# Patient Record
Sex: Male | Born: 1985 | Race: Black or African American | Hispanic: No | Marital: Single | Smoking: Former smoker
Health system: Southern US, Community
[De-identification: ages and names within clinical notes are randomized; demographics above are authoritative.]

## PROBLEM LIST (undated history)

## (undated) HISTORY — PX: HAND EXTENSOR TENDON EXCISION: SUR518

---

## 2011-12-08 ENCOUNTER — Ambulatory Visit: Payer: Self-pay | Admitting: Family Medicine

## 2013-06-30 ENCOUNTER — Ambulatory Visit: Payer: Self-pay | Admitting: Physician Assistant

## 2013-08-14 ENCOUNTER — Emergency Department: Payer: Self-pay | Admitting: Internal Medicine

## 2014-04-21 ENCOUNTER — Emergency Department: Payer: Self-pay | Admitting: Emergency Medicine

## 2014-05-03 ENCOUNTER — Emergency Department: Payer: Self-pay | Admitting: Emergency Medicine

## 2015-03-09 ENCOUNTER — Encounter: Payer: Self-pay | Admitting: General Practice

## 2015-03-09 ENCOUNTER — Emergency Department
Admission: EM | Admit: 2015-03-09 | Discharge: 2015-03-09 | Disposition: A | Discharge status: Home / Self Care | Payer: Self-pay | Attending: Emergency Medicine | Admitting: Emergency Medicine

## 2015-03-09 DIAGNOSIS — H6091 Unspecified otitis externa, right ear: Secondary | ICD-10-CM | POA: Insufficient documentation

## 2015-03-09 MED ORDER — IBUPROFEN 800 MG PO TABS: 800.0000 mg | ORAL_TABLET | Freq: Three times a day (TID) | ORAL | Status: AC | PRN

## 2015-03-09 MED ORDER — NEOMYCIN-POLYMYXIN-HC 3.5-10000-1 OT SOLN: 3.0000 [drp] | Freq: Three times a day (TID) | OTIC | Status: AC

## 2015-03-09 MED ORDER — IBUPROFEN 800 MG PO TABS: 800.0000 mg | ORAL_TABLET | Freq: Once | ORAL | Status: DC

## 2015-03-09 NOTE — ED Provider Notes (Signed)
[facility] Emergency Department Provider Note ____________________________________________  Time seen: ----------------------------------------- 6:35 PM on 03/09/2015 -----------------------------------------    I have reviewed the triage vital signs and the nursing notes.   HISTORY  Chief Complaint Otalgia    HPI Antonio Schneider is a 29 y.o. male with one week of pain in the right year. He has noticed blood on the earplugs uses at work. No other drainage. He has mild nasal congestion. No fevers chills shortness of breath chest pain. He denies headache nausea.  History reviewed. No pertinent past medical history.  There are no active problems to display for this patient.   History reviewed. No pertinent past surgical history.  Current Outpatient Rx  Name  Route  Sig  Dispense  Refill  . ibuprofen (ADVIL,MOTRIN) 800 MG tablet   Oral   Take 1 tablet (800 mg total) by mouth every 8 (eight) hours as needed.   15 tablet   0   . neomycin-polymyxin-hydrocortisone (CORTISPORIN) otic solution   Right Ear   Place 3 drops into the right ear 3 (three) times daily.   10 mL   0     Allergies Review of patient's allergies indicates no known allergies.  No family history on file.  Social History History  Substance Use Topics  . Smoking status: Never Smoker   . Smokeless tobacco: Never Used  . Alcohol Use: No    Review of Systems  Constitutional: Negative for fever. Eyes: Negative for visual changes. ENT: Negative for sore throat. Cardiovascular: Negative for chest pain. Respiratory: Negative for shortness of breath. Gastrointestinal: Negative for abdominal pain, vomiting and diarrhea. Genitourinary: Negative for dysuria. Musculoskeletal: Negative for back pain. Skin: Negative for rash. Neurological: Negative for headaches, focal weakness or numbness.    ____________________________________________   PHYSICAL EXAM:  VITAL  SIGNS: ED Triage Vitals  Enc Vitals Group     BP 03/09/15 1739 120/79 mmHg     Pulse Rate 03/09/15 1739 66     Resp 03/09/15 1739 17     Temp 03/09/15 1739 98 F (36.7 C)     Temp Source 03/09/15 1739 Oral     SpO2 03/09/15 1739 97 %     Weight 03/09/15 1739 170 lb (77.111 kg)     Height 03/09/15 1739 5\' 6"  (1.676 m)     Head Cir --      Peak Flow --      Pain Score 03/09/15 1734 7     Pain Loc --      Pain Edu? --      Excl. in GC? --     Constitutional: Alert and oriented. Well appearing and in no distress. Eyes: Conjunctivae are normal. PERRL. Normal extraocular movements. ENT   Head: Normocephalic and atraumatic.      Ear: Right. Mild erythema inside the canal. Normal TM with normal landmarks and cone of light. Non-tender to the external ear.   Nose: No congestion/rhinnorhea.   Mouth/Throat: Mucous membranes are moist.   Neck: No stridor. Hematological/Lymphatic/Immunilogical: No cervical lymphadenopathy. Cardiovascular: Normal rate, regular rhythm.  Respiratory: Normal respiratory effort.No wheezes/rales/rhonchi. Gastrointestinal: Soft and nontender. No distention. Musculoskeletal: Nontender with normal range of motion in all extremities.No lower extremity tenderness nor edema. Neurologic:  Normal speech and language. No gross focal neurologic deficits are appreciated. Skin:  Skin is warm, dry and intact. No rash noted. Psychiatric: Mood and affect are normal. Patient exhibits appropriate insight and judgment.  ____________________________________________       ____________________________________________  ____________________________________________      ____________________________________________   PROCEDURES    ____________________________________________   INITIAL IMPRESSION / ASSESSMENT AND PLAN / ED COURSE    ____________________________________________   FINAL CLINICAL IMPRESSION(S) / ED DIAGNOSES  Final diagnoses:   Otitis externa, right   Mild. Given ibuprofen in the ED. Will start Cortisporin otic. Given work note explaining he should put nothing in his ear for 2 days.   Ignacia BayleyRobert Tumey, PA-C 03/09/15 1839  \I was in the ER and available for consult during the time the patient is here apparently but did not see the patient  Arnaldo NatalPaul F Jeral Zick, MD 03/11/15 [phone]

## 2015-03-09 NOTE — Discharge Instructions (Signed)
Otitis Externa Otitis externa is a bacterial or fungal infection of the outer ear canal. This is the area from the eardrum to the outside of the ear. Otitis externa is sometimes called "swimmer's ear." CAUSES  Possible causes of infection include:  Swimming in dirty water.  Moisture remaining in the ear after swimming or bathing.  Mild injury (trauma) to the ear.  Objects stuck in the ear (foreign body).  Cuts or scrapes (abrasions) on the outside of the ear. SIGNS AND SYMPTOMS  The first symptom of infection is often itching in the ear canal. Later signs and symptoms may include swelling and redness of the ear canal, ear pain, and yellowish-white fluid (pus) coming from the ear. The ear pain may be worse when pulling on the earlobe. DIAGNOSIS  Your health care provider will perform a physical exam. A sample of fluid may be taken from the ear and examined for bacteria or fungi. TREATMENT  Antibiotic ear drops are often given for 10 to 14 days. Treatment may also include pain medicine or corticosteroids to reduce itching and swelling. HOME CARE INSTRUCTIONS   Apply antibiotic ear drops to the ear canal as prescribed by your health care provider.  Take medicines only as directed by your health care provider.  If you have diabetes, follow any additional treatment instructions from your health care provider.  Keep all follow-up visits as directed by your health care provider. PREVENTION   Keep your ear dry. Use the corner of a towel to absorb water out of the ear canal after swimming or bathing.  Avoid scratching or putting objects inside your ear. This can damage the ear canal or remove the protective wax that lines the canal. This makes it easier for bacteria and fungi to grow.  Avoid swimming in lakes, polluted water, or poorly chlorinated pools.  You may use ear drops made of rubbing alcohol and vinegar after swimming. Combine equal parts of white vinegar and alcohol in a bottle.  Put 3 or 4 drops into each ear after swimming. SEEK MEDICAL CARE IF:   You have a fever.  Your ear is still red, swollen, painful, or draining pus after 3 days.  Your redness, swelling, or pain gets worse.  You have a severe headache.  You have redness, swelling, pain, or tenderness in the area behind your ear. MAKE SURE YOU:   Understand these instructions.  Will watch your condition.  Will get help right away if you are not doing well or get worse. Document Released: 10/07/2005 Document Revised: 02/21/2014 Document Reviewed: 10/24/2011 [facility] Patient Information 2015 [place]. This information is not intended to replace advice given to you by your health care provider. Make sure you discuss any questions you have with your health care provider.   Take ibuprofen and use ear drops as directed and follow up at Urgent care if not improving. Return to the ER for any concerns.

## 2015-03-09 NOTE — ED Notes (Signed)
Pt. Arrived to ed from home with repots of right ear pain x 1 week. Pt reprots seeing blood after cleaning out eat. Pt alert and oriented.

## 2015-03-17 ENCOUNTER — Emergency Department
Admission: EM | Admit: 2015-03-17 | Discharge: 2015-03-17 | Disposition: A | Discharge status: Home / Self Care | Payer: Self-pay | Attending: Emergency Medicine | Admitting: Emergency Medicine

## 2015-03-17 DIAGNOSIS — Y9389 Activity, other specified: Secondary | ICD-10-CM | POA: Insufficient documentation

## 2015-03-17 DIAGNOSIS — S3992XA Unspecified injury of lower back, initial encounter: Secondary | ICD-10-CM | POA: Insufficient documentation

## 2015-03-17 DIAGNOSIS — Y92410 Unspecified street and highway as the place of occurrence of the external cause: Secondary | ICD-10-CM | POA: Insufficient documentation

## 2015-03-17 DIAGNOSIS — M545 Low back pain, unspecified: Secondary | ICD-10-CM

## 2015-03-17 DIAGNOSIS — Z79899 Other long term (current) drug therapy: Secondary | ICD-10-CM | POA: Insufficient documentation

## 2015-03-17 DIAGNOSIS — Y998 Other external cause status: Secondary | ICD-10-CM | POA: Insufficient documentation

## 2015-03-17 MED ORDER — KETOROLAC TROMETHAMINE 10 MG PO TABS
20.0000 mg | ORAL_TABLET | Freq: Once | ORAL | Status: AC
  Administered 2015-03-17: 20 mg via ORAL

## 2015-03-17 MED ORDER — TRAMADOL HCL 50 MG PO TABS
ORAL_TABLET | ORAL | Status: AC
  Administered 2015-03-17: 50 mg via ORAL
  Filled 2015-03-17: qty 1

## 2015-03-17 MED ORDER — KETOROLAC TROMETHAMINE 10 MG PO TABS: 10.0000 mg | ORAL_TABLET | Freq: Four times a day (QID) | ORAL | Status: AC | PRN

## 2015-03-17 MED ORDER — TRAMADOL HCL 50 MG PO TABS: 50.0000 mg | ORAL_TABLET | Freq: Four times a day (QID) | ORAL | Status: AC | PRN

## 2015-03-17 MED ORDER — TRAMADOL HCL 50 MG PO TABS
50.0000 mg | ORAL_TABLET | Freq: Once | ORAL | Status: AC
  Administered 2015-03-17: 50 mg via ORAL

## 2015-03-17 MED ORDER — KETOROLAC TROMETHAMINE 10 MG PO TABS
ORAL_TABLET | ORAL | Status: AC
  Filled 2015-03-17: qty 2

## 2015-03-17 NOTE — Discharge Instructions (Signed)
Take medications as directed
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
## 2015-09-18 ENCOUNTER — Encounter: Payer: Self-pay | Admitting: Emergency Medicine

## 2015-09-18 ENCOUNTER — Emergency Department: Payer: Self-pay

## 2015-09-18 ENCOUNTER — Emergency Department
Admission: EM | Admit: 2015-09-18 | Discharge: 2015-09-18 | Disposition: A | Discharge status: Home / Self Care | Payer: Self-pay | Attending: Emergency Medicine | Admitting: Emergency Medicine

## 2015-09-18 DIAGNOSIS — X58XXXA Exposure to other specified factors, initial encounter: Secondary | ICD-10-CM | POA: Insufficient documentation

## 2015-09-18 DIAGNOSIS — Y998 Other external cause status: Secondary | ICD-10-CM | POA: Insufficient documentation

## 2015-09-18 DIAGNOSIS — Y9289 Other specified places as the place of occurrence of the external cause: Secondary | ICD-10-CM | POA: Insufficient documentation

## 2015-09-18 DIAGNOSIS — S86911A Strain of unspecified muscle(s) and tendon(s) at lower leg level, right leg, initial encounter: Secondary | ICD-10-CM | POA: Insufficient documentation

## 2015-09-18 DIAGNOSIS — Y9389 Activity, other specified: Secondary | ICD-10-CM | POA: Insufficient documentation

## 2015-09-18 MED ORDER — KETOROLAC TROMETHAMINE 30 MG/ML IJ SOLN
60.0000 mg | Freq: Once | INTRAMUSCULAR | Status: AC
  Administered 2015-09-18: 60 mg via INTRAMUSCULAR
  Filled 2015-09-18: qty 2

## 2015-09-18 MED ORDER — NAPROXEN 500 MG PO TABS: 500.0000 mg | ORAL_TABLET | Freq: Two times a day (BID) | ORAL | Status: DC

## 2015-09-18 NOTE — ED Notes (Signed)
Patient with no complaints at this time. Respirations even and unlabored. Skin warm/dry. Discharge instructions reviewed with patient at this time. Patient given opportunity to voice concerns/ask questions. Patient discharged at this time and left Emergency Department with steady gait.   

## 2015-09-18 NOTE — Discharge Instructions (Signed)
1. Take pain medicine as needed (Naprosyn 500 mg twice daily 7 days). 2. You may remove Ace wrap to bathe and sleep. 3. Return to the ER for worsening symptoms, persistent vomiting, difficulty breathing or other concerns.

## 2015-09-18 NOTE — ED Notes (Signed)
Patient ambulatory to triage with steady gait, without difficulty or distress noted; pt reports right knee pain "for awhile, worse last few days" with no known injury

## 2015-09-18 NOTE — ED Provider Notes (Signed)
[facility] Emergency Department Provider Note  ____________________________________________  Time seen: Approximately 1:39 AM  I have reviewed the triage vital signs and the nursing notes.   HISTORY  Chief Complaint Knee Pain    HPI Antonio Schneider is a 29 y.o. male who presents to the ED with a chief complaint of nontraumatic right knee pain. States he has had right knee pain for years, worse over the past 2 weeks. Denies recent trauma, travel or injury. Patient does work at the [employer] and stands for 8 hours per day. Denies associated symptoms of leg weakness, Swelling, numbness/tingling. Denies recent fever, chills, chest pain, shortness breath, abdominal pain, nausea, vomiting, diarrhea. Nothing makes his pain better. Movement, especially walking up or down steps makes his pain worse   Past medical history None   There are no active problems to display for this patient.   History reviewed. No pertinent past surgical history.  Current Outpatient Rx  Name  Route  Sig  Dispense  Refill  . ibuprofen (ADVIL,MOTRIN) 800 MG tablet   Oral   Take 1 tablet (800 mg total) by mouth every 8 (eight) hours as needed.   15 tablet   0   . ketorolac (TORADOL) 10 MG tablet   Oral   Take 1 tablet (10 mg total) by mouth every 6 (six) hours as needed.   20 tablet   0   . traMADol (ULTRAM) 50 MG tablet   Oral   Take 1 tablet (50 mg total) by mouth every 6 (six) hours as needed.   20 tablet   0     Allergies Review of patient's allergies indicates no known allergies.  No family history on file.  Social History Social History  Substance Use Topics  . Smoking status: Never Smoker   . Smokeless tobacco: Never Used  . Alcohol Use: No    Review of Systems Constitutional: No fever/chills Eyes: No visual changes. ENT: No sore throat. Cardiovascular: Denies chest pain. Respiratory: Denies shortness of breath. Gastrointestinal: No abdominal pain.   No nausea, no vomiting.  No diarrhea.  No constipation. Genitourinary: Negative for dysuria. Musculoskeletal: Positive for right knee pain. Negative for back pain. Skin: Negative for rash. Neurological: Negative for headaches, focal weakness or numbness.  10-point ROS otherwise negative.  ____________________________________________   PHYSICAL EXAM:  VITAL SIGNS: ED Triage Vitals  Enc Vitals Group     BP 09/18/15 0028 128/80 mmHg     Pulse Rate 09/18/15 0028 78     Resp 09/18/15 0028 20     Temp 09/18/15 0028 98.1 F (36.7 C)     Temp Source 09/18/15 0028 Oral     SpO2 09/18/15 0028 97 %     Weight 09/18/15 0028 189 lb (85.73 kg)     Height 09/18/15 0028  (1.702 m)     Head Cir --      Peak Flow --      Pain Score 09/18/15 0027 8     Pain Loc --      Pain Edu? --      Excl. in GC? --     Constitutional: Alert and oriented. Well appearing and in no acute distress. Eyes: Conjunctivae are normal. PERRL. EOMI. Head: Atraumatic. Nose: No congestion/rhinnorhea. Mouth/Throat: Mucous membranes are moist.  Oropharynx non-erythematous. Neck: No stridor.   Cardiovascular: Normal rate, regular rhythm. Grossly normal heart sounds.  Good peripheral circulation. Respiratory: Normal respiratory effort.  No retractions. Lungs CTAB. Gastrointestinal: Soft  and nontender. No distention. No abdominal bruits. No CVA tenderness. Musculoskeletal: Right knee tender to palpation medially and anteriorly. There is no warmth or erythema to suggest septic joint. Limited range of motion secondary to pain. Calf is supple without evidence for compartment syndrome. Limb is symmetrically warm without evidence for ischemia. 2+ femoral, popliteal, distal pulses. There is no edema.  No joint effusions. Neurologic:  Normal speech and language. No gross focal neurologic deficits are appreciated. No gait instability. Skin:  Skin is warm, dry and intact. No rash noted. Psychiatric: Mood and affect are  normal. Speech and behavior are normal.  ____________________________________________   LABS (all labs ordered are listed, but only abnormal results are displayed)  Labs Reviewed - No data to display ____________________________________________  EKG  None ____________________________________________  RADIOLOGY  Right knee x-rays (viewed by me, interpreted per Dr. Karie KirksBloomer): No acute fracture deformity or dislocation.  Elongated lower pole of the patella can be associated with jumper's knee, recommend correlation with point tenderness. ____________________________________________   PROCEDURES  Procedure(s) performed: None  Critical Care performed: No  ____________________________________________   INITIAL IMPRESSION / ASSESSMENT AND PLAN / ED COURSE  Pertinent labs & imaging results that were available during my care of the patient were reviewed by me and considered in my medical decision making (see chart for details).  29 year old male with acute on chronic right knee pain, most likely secondary to medial meniscus strain. Will apply Ace wrap, treat with NSAIDs and orthopedics follow-up. Strict return precautions given. Patient verbalizes understanding and agrees with plan of care. ____________________________________________   FINAL CLINICAL IMPRESSION(S) / ED DIAGNOSES  Final diagnoses:  Knee strain, right, initial encounter      Irean HongJade J Sung, MD 09/18/15 [phone]

## 2015-11-13 ENCOUNTER — Emergency Department: Payer: Self-pay

## 2015-11-13 ENCOUNTER — Emergency Department
Admission: EM | Admit: 2015-11-13 | Discharge: 2015-11-13 | Disposition: A | Discharge status: Home / Self Care | Payer: Self-pay | Attending: Emergency Medicine | Admitting: Emergency Medicine

## 2015-11-13 ENCOUNTER — Encounter: Payer: Self-pay | Admitting: Emergency Medicine

## 2015-11-13 DIAGNOSIS — S83001A Unspecified subluxation of right patella, initial encounter: Secondary | ICD-10-CM

## 2015-11-13 DIAGNOSIS — Z791 Long term (current) use of non-steroidal anti-inflammatories (NSAID): Secondary | ICD-10-CM | POA: Insufficient documentation

## 2015-11-13 DIAGNOSIS — S83011A Lateral subluxation of right patella, initial encounter: Secondary | ICD-10-CM | POA: Insufficient documentation

## 2015-11-13 DIAGNOSIS — Y998 Other external cause status: Secondary | ICD-10-CM | POA: Insufficient documentation

## 2015-11-13 DIAGNOSIS — Y9389 Activity, other specified: Secondary | ICD-10-CM | POA: Insufficient documentation

## 2015-11-13 DIAGNOSIS — Y9289 Other specified places as the place of occurrence of the external cause: Secondary | ICD-10-CM | POA: Insufficient documentation

## 2015-11-13 DIAGNOSIS — Z87891 Personal history of nicotine dependence: Secondary | ICD-10-CM | POA: Insufficient documentation

## 2015-11-13 DIAGNOSIS — X58XXXA Exposure to other specified factors, initial encounter: Secondary | ICD-10-CM | POA: Insufficient documentation

## 2015-11-13 MED ORDER — MELOXICAM 15 MG PO TABS: 15.0000 mg | ORAL_TABLET | Freq: Every day | ORAL | Status: DC

## 2015-11-13 MED ORDER — MELOXICAM 15 MG PO TABS: 15.0000 mg | ORAL_TABLET | Freq: Every day | ORAL | Status: AC

## 2015-11-13 MED ORDER — HYDROCODONE-ACETAMINOPHEN 5-325 MG PO TABS: 1.0000 | ORAL_TABLET | ORAL | Status: AC | PRN
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
## 2015-11-13 NOTE — ED Notes (Signed)
Pain in his right knee, old injury, states swelling also.

## 2015-11-16 ENCOUNTER — Telehealth: Payer: Self-pay | Admitting: Emergency Medicine

## 2015-11-16 NOTE — ED Notes (Signed)
Called patient because his [employer] has called asking for changes to work excuse.  i explained that he does need to wear brace until he is seen by orthopedic doctor for follow up.  He agrees to call to make an appt.  i have written a note and placed in the lobby for him to pick up.

## 2015-12-21 ENCOUNTER — Emergency Department
Admission: EM | Admit: 2015-12-21 | Discharge: 2015-12-21 | Disposition: A | Discharge status: Home / Self Care | Payer: No Typology Code available for payment source | Attending: Emergency Medicine | Admitting: Emergency Medicine

## 2015-12-21 ENCOUNTER — Encounter: Payer: Self-pay | Admitting: Emergency Medicine

## 2015-12-21 DIAGNOSIS — M25561 Pain in right knee: Secondary | ICD-10-CM | POA: Insufficient documentation

## 2015-12-21 DIAGNOSIS — Z87891 Personal history of nicotine dependence: Secondary | ICD-10-CM | POA: Insufficient documentation

## 2015-12-21 DIAGNOSIS — Z791 Long term (current) use of non-steroidal anti-inflammatories (NSAID): Secondary | ICD-10-CM | POA: Insufficient documentation

## 2015-12-21 DIAGNOSIS — G8929 Other chronic pain: Secondary | ICD-10-CM | POA: Insufficient documentation

## 2015-12-21 MED ORDER — NAPROXEN 500 MG PO TABS: 500.0000 mg | ORAL_TABLET | Freq: Two times a day (BID) | ORAL | Status: AC

## 2015-12-21 NOTE — ED Notes (Signed)
Having pain to right knee several years ago.. Denies recent injury .Marland Kitchen Was seen about 1 month ago and states pain is still there

## 2015-12-21 NOTE — ED Provider Notes (Signed)
[facility] Emergency Department Provider Note  ____________________________________________  Time seen: Approximately 4:23 PM  I have reviewed the triage vital signs and the nursing notes.   HISTORY  Chief Complaint Knee Pain    HPI Antonio Schneider is a 30 y.o. male patient's chronic knee pain for several years. When questioned patient stated greater than 10 years. Denies any recent injury. Patient was seen twice in this department in the past 2 months for same complaint. Patient stated no improvement since his last visit. Patient does not follow orthopedics as directed. Patient rates his pain as a 9/10. No palliative measures taken for this complaint.   History reviewed. No pertinent past medical history.  There are no active problems to display for this patient.   History reviewed. No pertinent past surgical history.  Current Outpatient Rx  Name  Route  Sig  Dispense  Refill  . HYDROcodone-acetaminophen (NORCO/VICODIN) 5-325 MG tablet   Oral   Take 1 tablet by mouth every 4 (four) hours as needed for moderate pain.   12 tablet   0   . ibuprofen (ADVIL,MOTRIN) 800 MG tablet   Oral   Take 1 tablet (800 mg total) by mouth every 8 (eight) hours as needed.   15 tablet   0   . ketorolac (TORADOL) 10 MG tablet   Oral   Take 1 tablet (10 mg total) by mouth every 6 (six) hours as needed.   20 tablet   0   . meloxicam (MOBIC) 15 MG tablet   Oral   Take 1 tablet (15 mg total) by mouth daily.   30 tablet   0   . naproxen (NAPROSYN) 500 MG tablet   Oral   Take 1 tablet (500 mg total) by mouth 2 (two) times daily with a meal.   60 tablet   2   . traMADol (ULTRAM) 50 MG tablet   Oral   Take 1 tablet (50 mg total) by mouth every 6 (six) hours as needed.   20 tablet   0     Allergies Review of patient's allergies indicates no known allergies.  No family history on file.  Social History Social History  Substance Use Topics  .  Smoking status: Former Games developer  . Smokeless tobacco: Never Used  . Alcohol Use: No    Review of Systems Constitutional: No fever/chills Eyes: No visual changes. ENT: No sore throat. Cardiovascular: Denies chest pain. Respiratory: Denies shortness of breath. Gastrointestinal: No abdominal pain.  No nausea, no vomiting.  No diarrhea.  No constipation. Genitourinary: Negative for dysuria. Musculoskeletal: Right knee pain Skin: Negative for rash. Neurological: Negative for headaches, focal weakness or numbness. 10-point ROS otherwise negative.  ____________________________________________   PHYSICAL EXAM:  VITAL SIGNS: ED Triage Vitals  Enc Vitals Group     BP 12/21/15 1557 115/78 mmHg     Pulse Rate 12/21/15 1557 62     Resp 12/21/15 1557 18     Temp 12/21/15 1557 97.8 F (36.6 C)     Temp src --      SpO2 12/21/15 1557 98 %     Weight 12/21/15 1557 175 lb (79.379 kg)     Height 12/21/15 1557  (1.676 m)     Head Cir --      Peak Flow --      Pain Score 12/21/15 1556 9     Pain Loc --      Pain Edu? --  Excl. in GC? --     Constitutional: Alert and oriented. Well appearing and in no acute distress. Eyes: Conjunctivae are normal. PERRL. EOMI. Head: Atraumatic. Nose: No congestion/rhinnorhea. Mouth/Throat: Mucous membranes are moist.  Oropharynx non-erythematous. Neck: No stridor. No cervical spine tenderness to palpation. Hematological/Lymphatic/Immunilogical: No cervical lymphadenopathy. Cardiovascular: Normal rate, regular rhythm. Grossly normal heart sounds.  Good peripheral circulation. Respiratory: Normal respiratory effort.  No retractions. Lungs CTAB. Gastrointestinal: Soft and nontender. No distention. No abdominal bruits. No CVA tenderness. Musculoskeletal: No obvious deformity of the right knee. There is no effusion or crepitus upon palpation. Patient is full nuchal range of motion has some mild guarding with palpation of the patella. Neurologic:   Normal speech and language. No gross focal neurologic deficits are appreciated. No gait instability. Skin:  Skin is warm, dry and intact. No rash noted. Psychiatric: Mood and affect are normal. Speech and behavior are normal.  ____________________________________________   LABS (all labs ordered are listed, but only abnormal results are displayed)  Labs Reviewed - No data to display ____________________________________________  EKG   ____________________________________________  RADIOLOGY  Reviewed x-rays from last 2 previous visits ____________________________________________   PROCEDURES  Procedure(s) performed:   Critical Care performed: No  ____________________________________________   INITIAL IMPRESSION / ASSESSMENT AND PLAN / ED COURSE  Pertinent labs & imaging results that were available during my care of the patient were reviewed by me and considered in my medical decision making (see chart for details).  Right chronic knee pain. Discussed x-ray findings with palpation. Advised patient to purchase over-the-counter elastic knee support to wear at work. Patient given a prescription for naproxen. Patient advised to follow-up orthopedic clinic if condition persists. ____________________________________________   FINAL CLINICAL IMPRESSION(S) / ED DIAGNOSES  Final diagnoses:  Chronic knee pain, right      Joni Reining, PA-C 12/21/15 1639  Minna Antis, MD 12/22/15 2100

## 2015-12-21 NOTE — Discharge Instructions (Signed)
Advised patient to a elastic knee support while working.

## 2015-12-21 NOTE — ED Notes (Signed)
Pt with chronic right knee pain x 15 years. Denies recent injury. + pulses.

## 2016-11-05 IMAGING — CR DG KNEE COMPLETE 4+V*R*
1 series · 4 of 4 positions shown · non-contrast
Comparison: None.

CLINICAL DATA: Chronic anterior knee pain, worsening for 2-3 days.

EXAM:
RIGHT KNEE - COMPLETE 4+ VIEW

[Series 1: t knee ap right · 0.14mm/px · 4 of 4 slices shown]
[im 1/4]
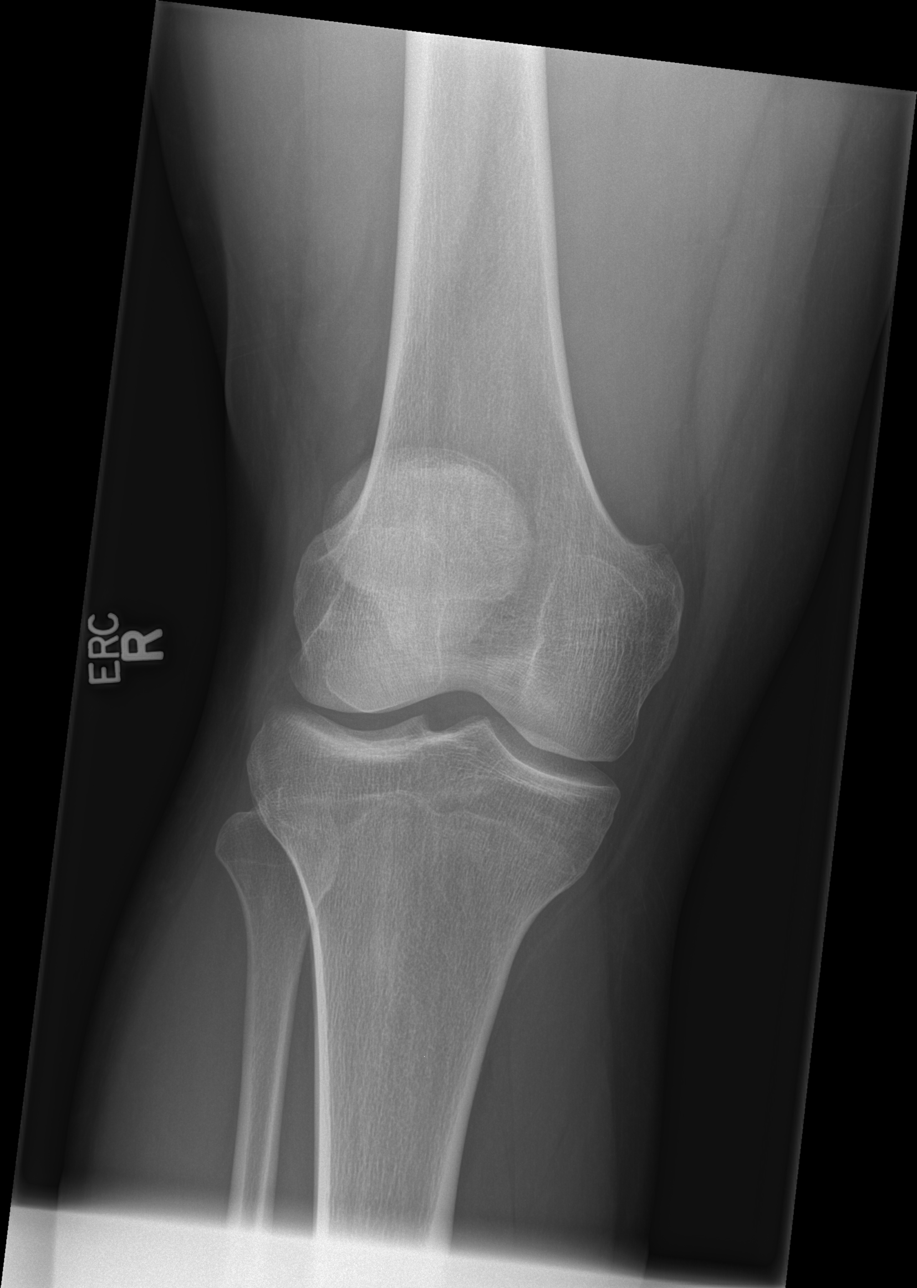
[im 2/4]
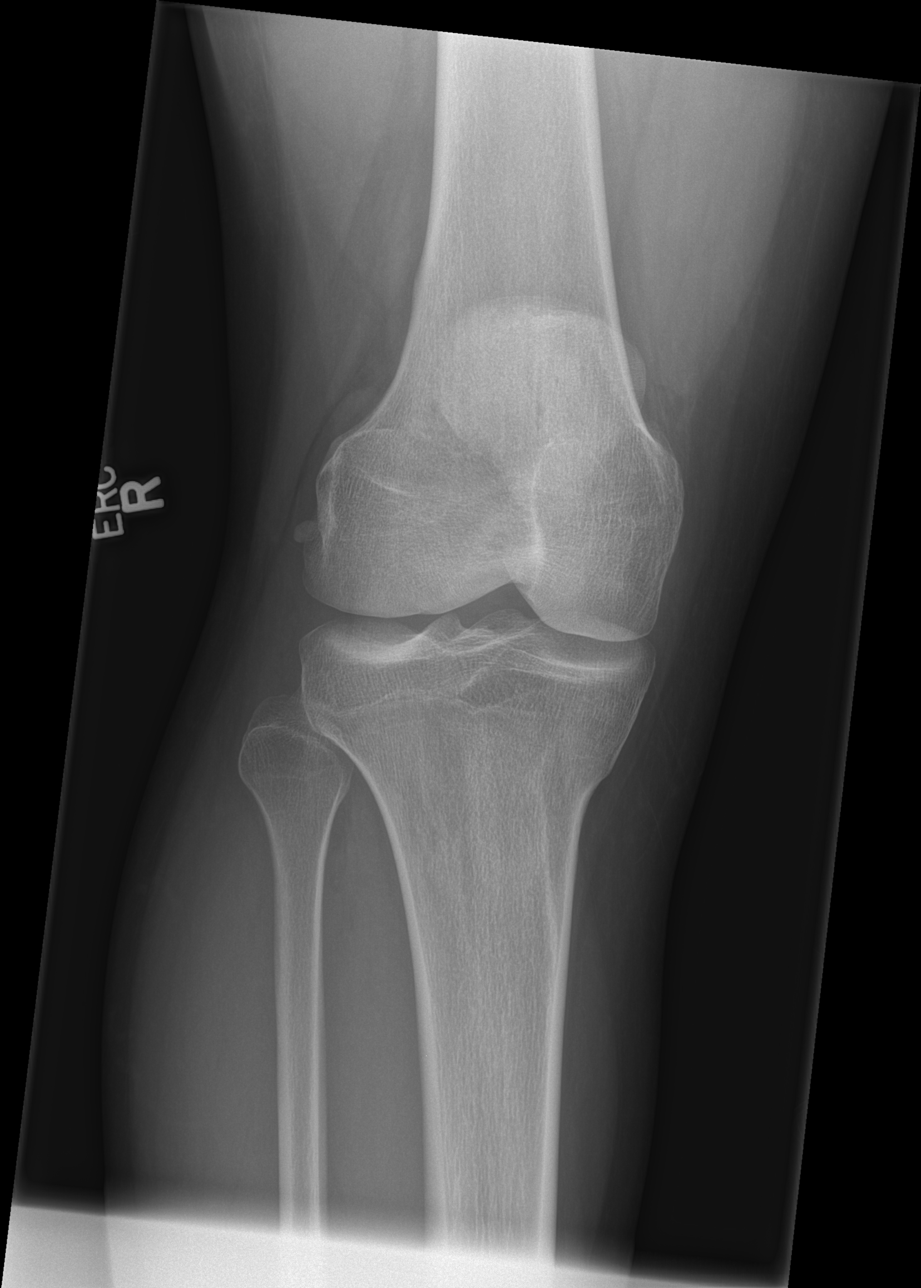
[im 3/4]
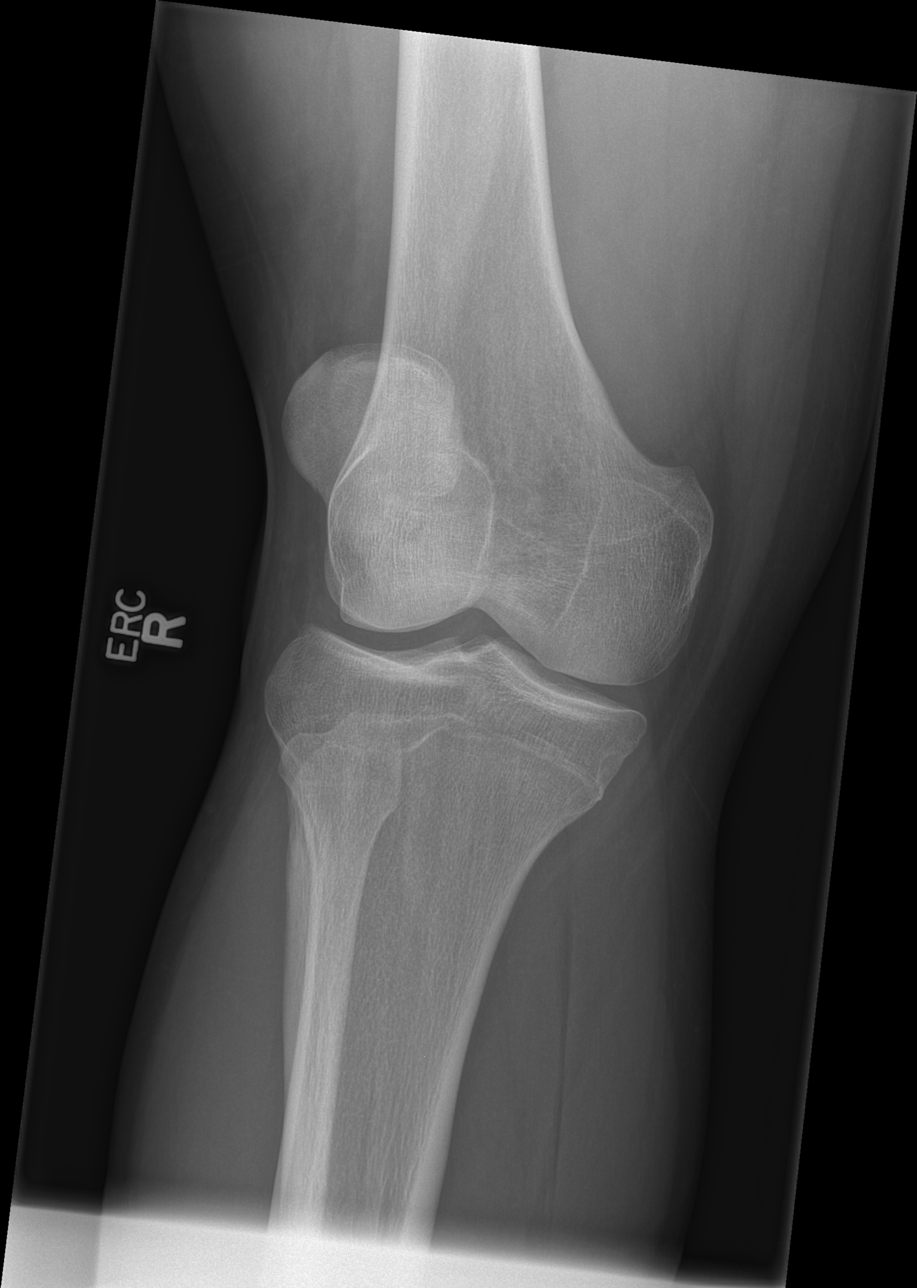
[im 4/4]
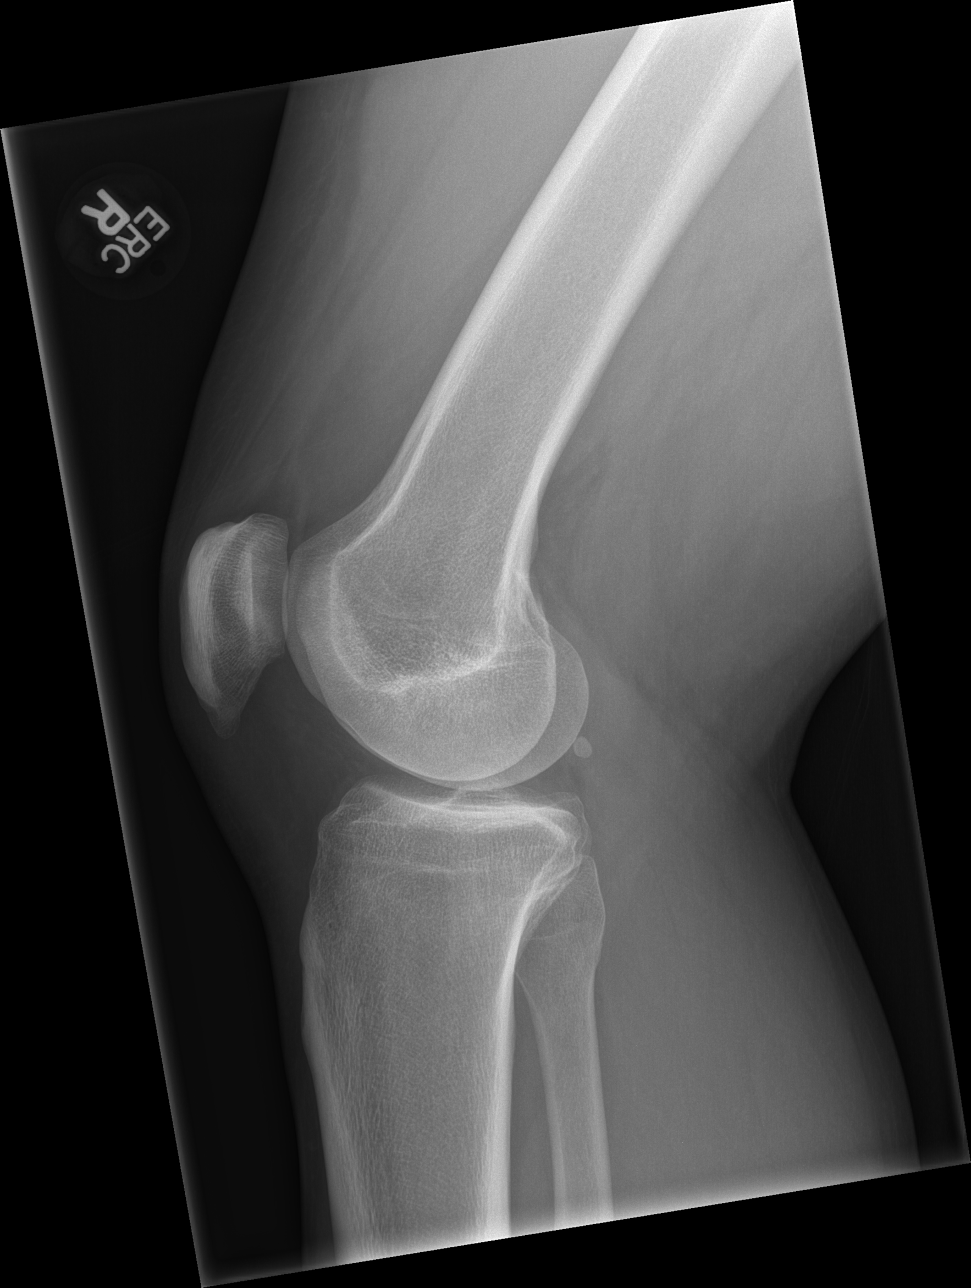

[4 of 4 positions shown; findings below may reference images not displayed]

FINDINGS: There is no evidence of fracture, dislocation, or joint effusion.
Elongated Lower pole of the patella. There is no evidence of
arthropathy or other focal bone abnormality. Soft tissues are
unremarkable.
IMPRESSION: No acute fracture deformity or dislocation.

Elongated lower pole of the patella can be associated with jumper's
knee, recommend correlation with point tenderness.

## 2023-08-12 ENCOUNTER — Ambulatory Visit
Admission: EM | Admit: 2023-08-12 | Discharge: 2023-08-12 | Disposition: A | Discharge status: Home / Self Care | Payer: Self-pay

## 2023-08-12 DIAGNOSIS — S46811A Strain of other muscles, fascia and tendons at shoulder and upper arm level, right arm, initial encounter: Secondary | ICD-10-CM

## 2023-08-12 MED ORDER — PREDNISONE 10 MG (21) PO TBPK: ORAL_TABLET | Freq: Every day | ORAL | 0 refills | Status: AC

## 2023-08-12 MED ORDER — CYCLOBENZAPRINE HCL 10 MG PO TABS: 10.0000 mg | ORAL_TABLET | Freq: Every day | ORAL | 0 refills | Status: AC

## 2023-08-12 MED ORDER — KETOROLAC TROMETHAMINE 30 MG/ML IJ SOLN
30.0000 mg | Freq: Once | INTRAMUSCULAR | Status: AC
  Administered 2023-08-12: 30 mg via INTRAMUSCULAR

## 2023-08-12 NOTE — ED Provider Notes (Signed)
Antonio Schneider    CSN: [id number] Arrival date & time: 08/12/23  1404      History   Chief Complaint No chief complaint on file.   HPI Antonio Schneider is a 37 y.o. male.   Patient presents for evaluation of posterior right shoulder pain present for 2 weeks.  Pain has been constant, exacerbated by twisting and when arm is raised above head.  Does not radiate, not experiencing numbness or tingling.  Denies prior injury.  Denies precipitating event, new injury or trauma.  Attempted use of ibuprofen which has been ineffective.  There are no problems to display for this patient.   Past Surgical History:  Procedure Laterality Date   HAND EXTENSOR TENDON EXCISION         Home Medications    Prior to Admission medications   Medication Sig Start Date End Date Taking? Authorizing Provider  cyclobenzaprine (FLEXERIL) 10 MG tablet Take 1 tablet (10 mg total) by mouth at bedtime. 08/12/23  Yes Antonio Schneider R, NP  predniSONE (STERAPRED UNI-PAK 21 TAB) 10 MG (21) TBPK tablet Take by mouth daily. Take 6 tabs by mouth daily  for 1 days, then 5 tabs for 1 days, then 4 tabs for 1 days, then 3 tabs for 1 days, 2 tabs for 1 days, then 1 tab by mouth daily for 1 days 08/12/23  Yes Antonio Schneider R, NP  Turmeric 02-999 MG CAPS Take by mouth.   Yes [provider]  HYDROcodone-acetaminophen (NORCO/VICODIN) 5-325 MG tablet Take 1 tablet by mouth every 4 (four) hours as needed for moderate pain. Patient not taking: Reported on 08/12/2023 11/13/15   Antonio Schneider B, FNP  ibuprofen (ADVIL,MOTRIN) 800 MG tablet Take 1 tablet (800 mg total) by mouth every 8 (eight) hours as needed. 03/09/15   Antonio Bayley, PA-C  ketorolac (TORADOL) 10 MG tablet Take 1 tablet (10 mg total) by mouth every 6 (six) hours as needed. 03/17/15   Antonio Reining, PA-C  meloxicam (MOBIC) 15 MG tablet Take 1 tablet (15 mg total) by mouth daily. 11/13/15   Antonio Pester, FNP    Family History Family  History  Problem Relation Age of Onset   Healthy Mother    Healthy Brother     Social History Social History   Tobacco Use   Smoking status: Former   Smokeless tobacco: Never  Substance Use Topics   Alcohol use: No   Drug use: Yes    Types: Marijuana     Allergies   Patient has no known allergies.   Review of Systems Review of Systems   Physical Exam Triage Vital Signs ED Triage Vitals  Encounter Vitals Group     BP 08/12/23 1427 (!) 149/95     Systolic BP Percentile --      Diastolic BP Percentile --      Pulse Rate 08/12/23 1427 (!) 59     Resp 08/12/23 1427 16     Temp 08/12/23 1427 98 F (36.7 C)     Temp Source 08/12/23 1427 Oral     SpO2 08/12/23 1427 98 %     Weight 08/12/23 1421 207 lb (93.9 kg)     Height 08/12/23 1421 5\' 7"  (1.702 m)     Head Circumference --      Peak Flow --      Pain Score 08/12/23 1420 8     Pain Loc --      Pain Education --  Exclude from Growth Chart --    No data found.  Updated Vital Signs BP (!) 149/95 (BP Location: Left Arm)   Pulse (!) 59   Temp 98 F (36.7 C) (Oral)   Resp 16   Ht 5\' 7"  (1.702 m)   Wt 207 lb (93.9 kg)   SpO2 98%   BMI 32.42 kg/m   Visual Acuity Right Eye Distance:   Left Eye Distance:   Bilateral Distance:    Right Eye Near:   Left Eye Near:    Bilateral Near:     Physical Exam Constitutional:      Appearance: Normal appearance.  Eyes:     Extraocular Movements: Extraocular movements intact.  Pulmonary:     Effort: Pulmonary effort is normal.  Musculoskeletal:     Comments: Generalized tenderness to the right trapezius without ecchymosis swelling or deformity, no spinal tenderness to the thoracic or cervical chain, able to twist turn and bend pain is elicited with twisting, has full range of motion of the right shoulder, no pain elicited with movement, negative Hawking sign  Neurological:     Mental Status: He is alert and oriented to person, place, and time. Mental status is  at baseline.      UC Treatments / Results  Labs (all labs ordered are listed, but only abnormal results are displayed) Labs Reviewed - No data to display  EKG   Radiology No results found.  Procedures Procedures (including critical care time)  Medications Ordered in UC Medications  ketorolac (TORADOL) 30 MG/ML injection 30 mg (30 mg Intramuscular Given 08/12/23 1451)    Initial Impression / Assessment and Plan / UC Course  I have reviewed the triage vital signs and the nursing notes.  Pertinent labs & imaging results that were available during my care of the patient were reviewed by me and considered in my medical decision making (see chart for details).  Right trapezius strain, initial encounter  Etiology of symptoms most likely muscular, imaging deferred due to lack of injury, Toradol IM given, prescribed prednisone taper and Flexeril for outpatient use recommended RICE, heat massage stretching and activity as tolerated, walking referral given to orthopedics if symptoms continue to persist or worsen Final Clinical Impressions(s) / UC Diagnoses   Final diagnoses:  Trapezius strain, right, initial encounter     Discharge Instructions      Today you are evaluated for pain to your right shoulder which on exam is localized to the trapezius muscle, no tenderness over the spine or the rotator cuff  Given an injection of Toradol today here in the office to help reduce inflammation and help with pain, daily will see some improvement in symptoms in 30 minutes to an hour  Starting tomorrow take prednisone every morning with food as directed to help reduce inflammation and manage pain may take Tylenol 500 to 1000 g every 6 hours as needed additionally for severe pain  May use muscle relaxant at bedtime as needed for additional comfort, be mindful this will make you feel sleepy  May use ice or heat over the affected area in 10 to 15-minute intervals to help reduce inflammation  and help with pain  Whenever sitting and lying place pillows underneath arm and behind back for comfort and support  Attempt to massage and stretch affected area at least 1 time daily to further reduce symptoms  You may continue activity for which you are able to tolerate  If you continue to have symptoms past use of  medicine supportive care please follow-up with orthopedic specialist, several also is listed on front page for further evaluation and management   ED Prescriptions     Medication Sig Dispense Auth. Provider   predniSONE (STERAPRED UNI-PAK 21 TAB) 10 MG (21) TBPK tablet Take by mouth daily. Take 6 tabs by mouth daily  for 1 days, then 5 tabs for 1 days, then 4 tabs for 1 days, then 3 tabs for 1 days, 2 tabs for 1 days, then 1 tab by mouth daily for 1 days 21 tablet Jeannelle Wiens R, NP   cyclobenzaprine (FLEXERIL) 10 MG tablet Take 1 tablet (10 mg total) by mouth at bedtime. 10 tablet Valinda Hoar, NP      PDMP not reviewed this encounter.   Valinda Hoar, NP 08/12/23 1537

## 2023-08-12 NOTE — Discharge Instructions (Addendum)
Today you are evaluated for pain to your right shoulder which on exam is localized to the trapezius muscle, no tenderness over the spine or the rotator cuff  Given an injection of Toradol today here in the office to help reduce inflammation and help with pain, daily will see some improvement in symptoms in 30 minutes to an hour  Starting tomorrow take prednisone every morning with food as directed to help reduce inflammation and manage pain may take Tylenol 500 to 1000 g every 6 hours as needed additionally for severe pain  May use muscle relaxant at bedtime as needed for additional comfort, be mindful this will make you feel sleepy  May use ice or heat over the affected area in 10 to 15-minute intervals to help reduce inflammation and help with pain  Whenever sitting and lying place pillows underneath arm and behind back for comfort and support  Attempt to massage and stretch affected area at least 1 time daily to further reduce symptoms  You may continue activity for which you are able to tolerate  If you continue to have symptoms past use of medicine supportive care please follow-up with orthopedic specialist, several also is listed on front page for further evaluation and management

## 2023-08-12 NOTE — ED Triage Notes (Signed)
Patient in office today chief complaint right shoulder pain also shoulder area [id number] Denies injury  OTC: Ibuprofen
